# Patient Record
Sex: Male | Born: 1945 | Race: White | Hispanic: No | Marital: Married
Health system: Southern US, Community
[De-identification: ages and names within clinical notes are randomized; demographics above are authoritative.]

---

## 2006-03-25 ENCOUNTER — Inpatient Hospital Stay (HOSPITAL_COMMUNITY): Admission: AD | Admit: 2006-03-25 | Discharge: 2006-04-05 | Payer: Self-pay | Admitting: Pulmonary Disease

## 2006-03-25 ENCOUNTER — Ambulatory Visit: Payer: Self-pay | Admitting: Critical Care Medicine

## 2006-03-27 ENCOUNTER — Ambulatory Visit: Payer: Self-pay | Admitting: Cardiology

## 2006-03-29 ENCOUNTER — Ambulatory Visit: Payer: Self-pay | Admitting: Internal Medicine

## 2006-07-18 ENCOUNTER — Ambulatory Visit: Payer: Self-pay | Admitting: Pulmonary Disease

## 2007-10-16 IMAGING — CR DG CHEST 1V PORT
1 series · 1 of 1 positions shown · non-contrast
Comparison: None.

CLINICAL DATA: Acute renal failure.  Evaluate right IJ line insertion.
 PORTABLE CHEST ? 1 VIEW:

[view not recorded]
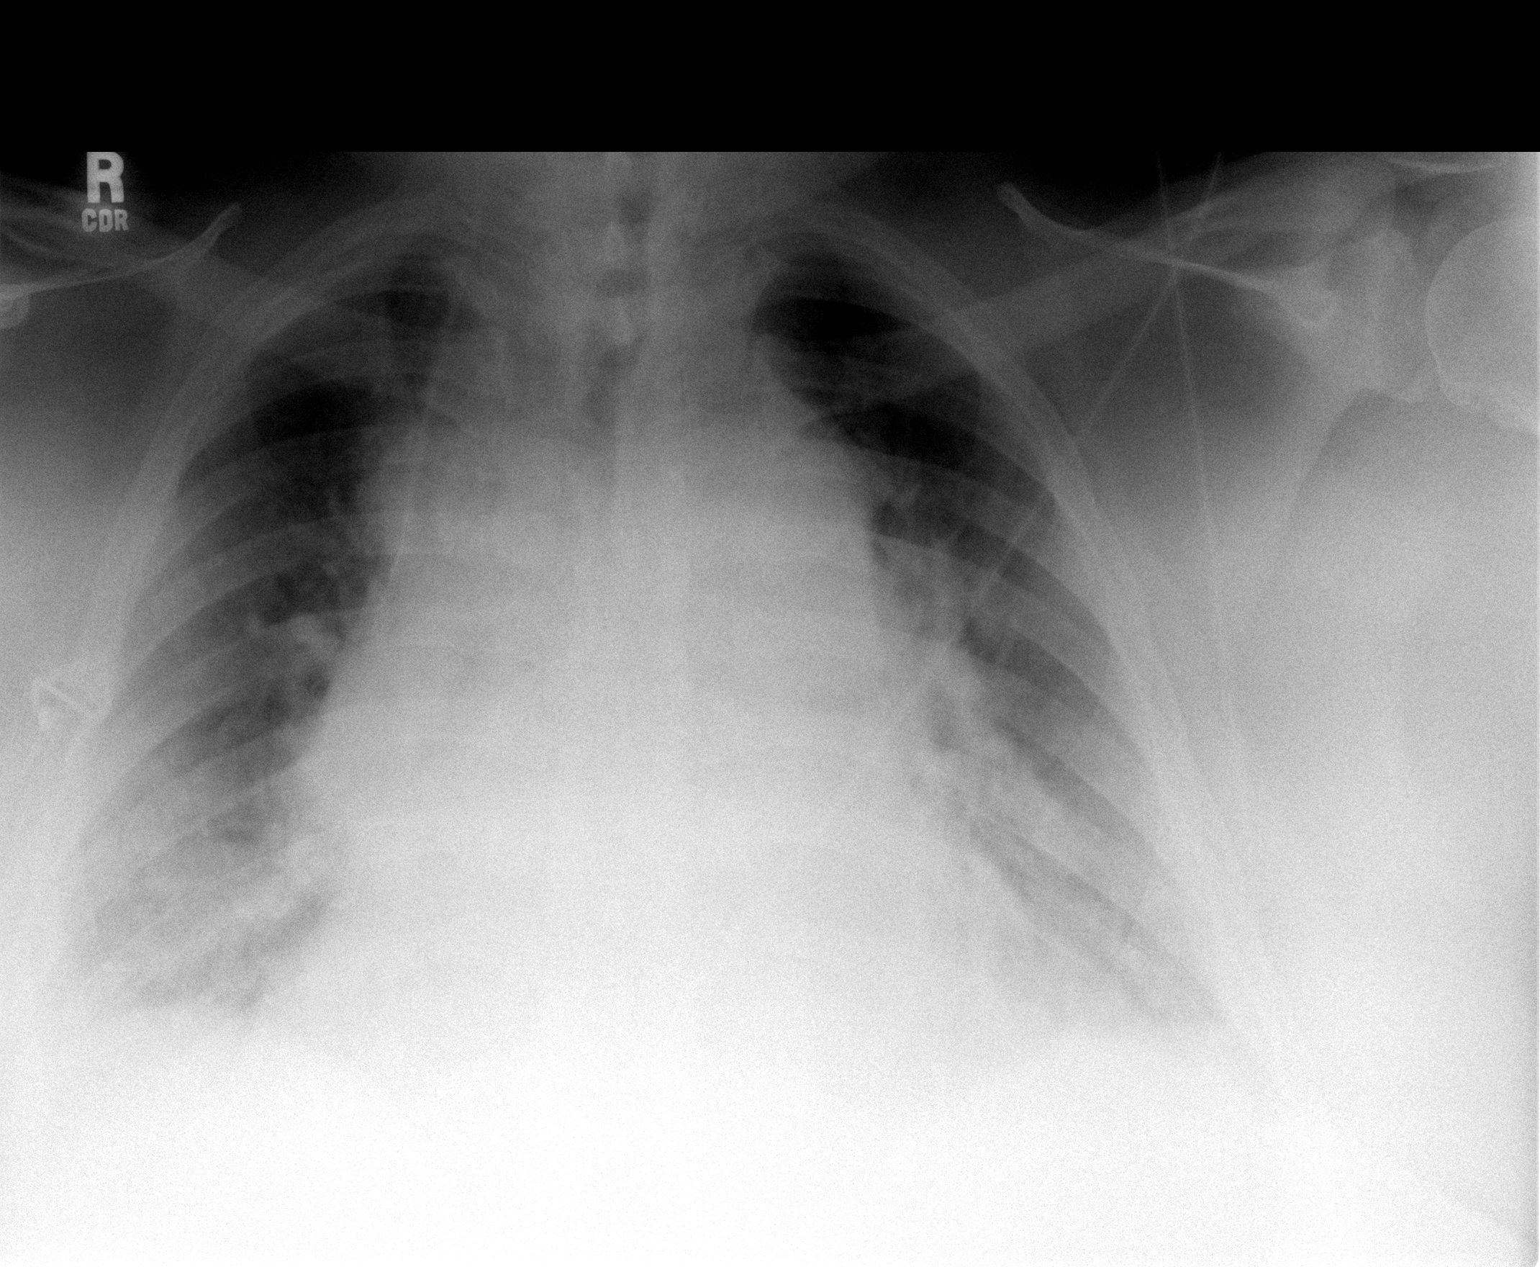

[1 of 1 positions shown; findings below may reference images not displayed]

FINDINGS: Right-sided IJ central venous catheter is noted with tip in the projection of the SVC.  Heart size is enlarged.  There is pulmonary venous congestion.  There is bibasilar atelectasis.
IMPRESSION: 1.  Cardiomegaly and pulmonary venous congestion. 
 2.  Right-sided IJ catheter tip is in the SVC.  No pneumothorax noted.

## 2007-10-17 IMAGING — CR DG CHEST 1V PORT
1 series · 1 of 1 positions shown · non-contrast
Comparison: 03/25/06.

CLINICAL DATA: Acute renal failure.  
 PORTABLE CHEST - 1 VIEW 03/26/06:

[view not recorded]
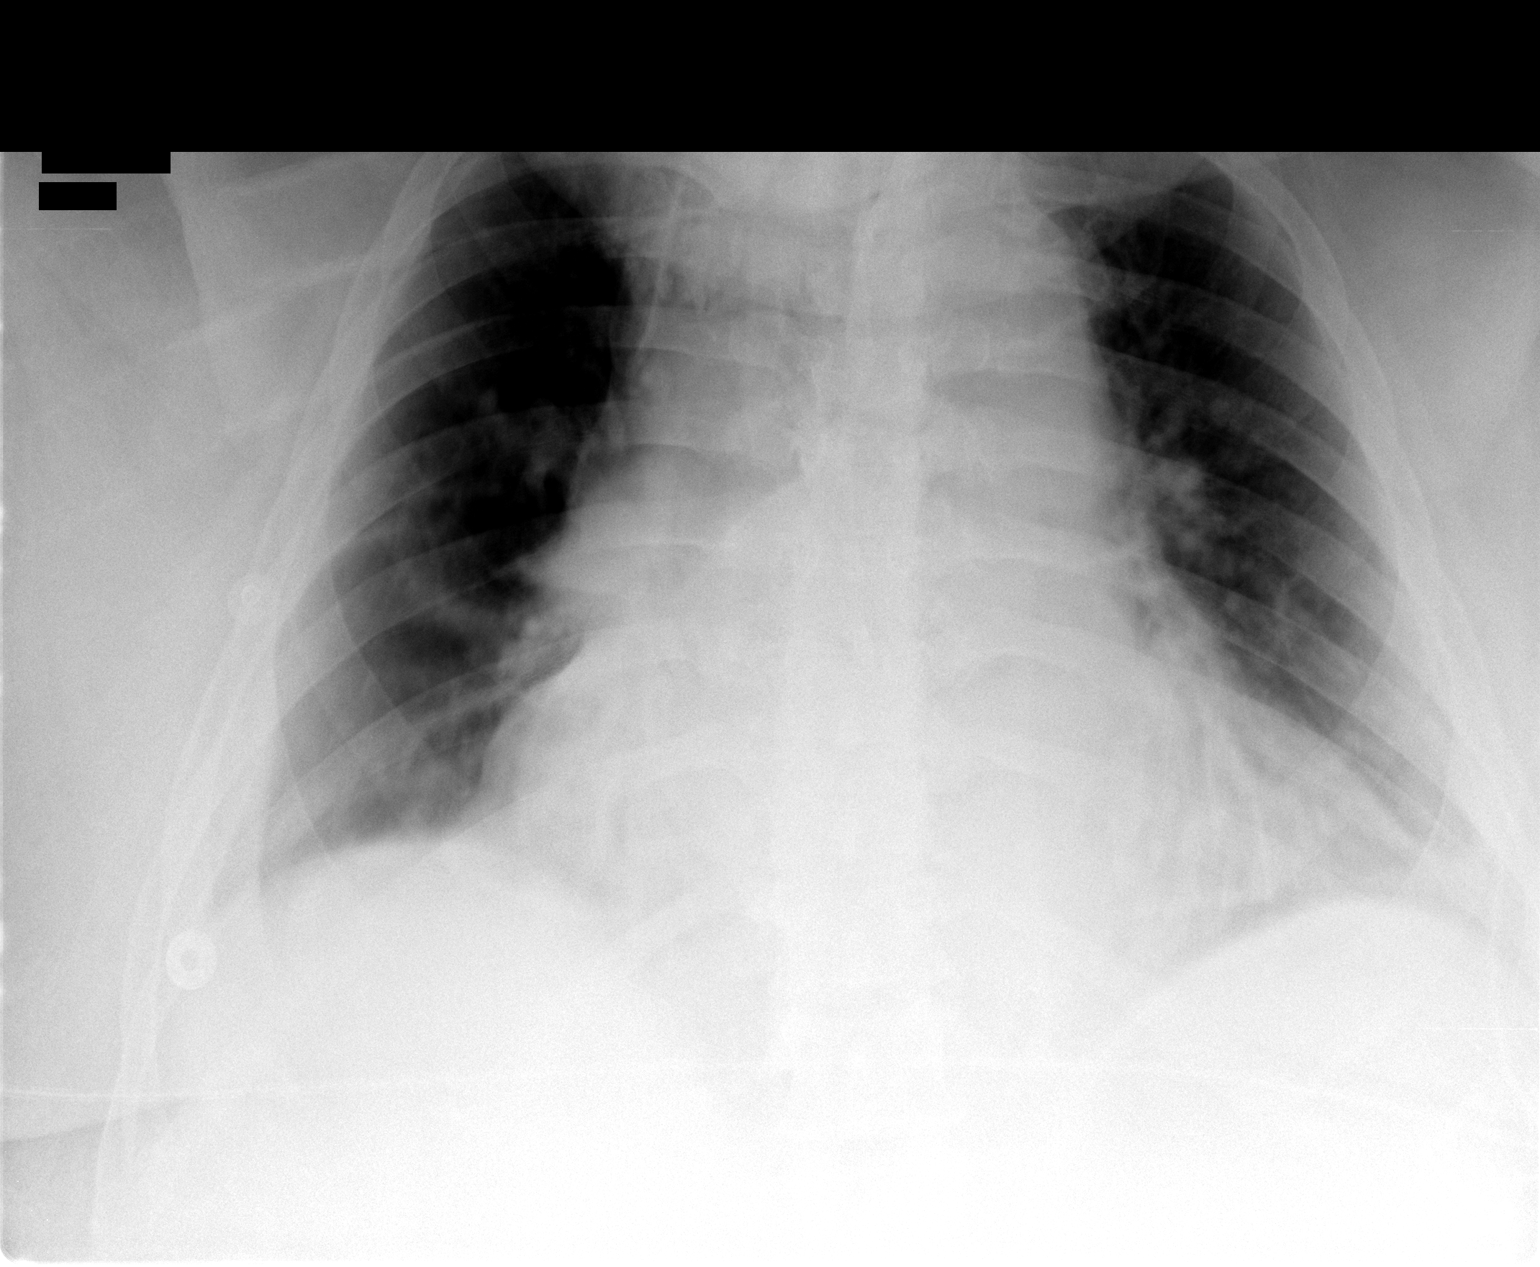

[1 of 1 positions shown; findings below may reference images not displayed]

FINDINGS: Right-sided central line again terminates over the mid superior vena cava.  Exam is mildly motion degraded.  There is mild cardiomegaly.  Prominence of the superior mediastinum is likely technique related.  Decreased interstitial prominence likely relates to decreased pulmonary venous congestion. Improved aeration at the bases consistent with decreased atelectasis.  There may be a small right-sided pleural effusion.  No pneumothorax.
IMPRESSION: 1.  Limited examination due to motion artifact and AP supine technique. 
 2.  Cardiomegaly with resolved pulmonary venous congestion.  
 3.  Decreased bibasilar atelectasis.  
 4.  Prominence of the superior mediastinum is likely technique related but PA and lateral radiographs are recommended when patient is clinically improved.

## 2007-10-18 IMAGING — CR DG CHEST 1V PORT
1 series · 1 of 1 positions shown · non-contrast
Comparison: 03/25/06 AND 03/26/06.

CLINICAL DATA: Acute renal failure.  
 PORTABLE CHEST ? ([DATE] HOURS):

[view not recorded]
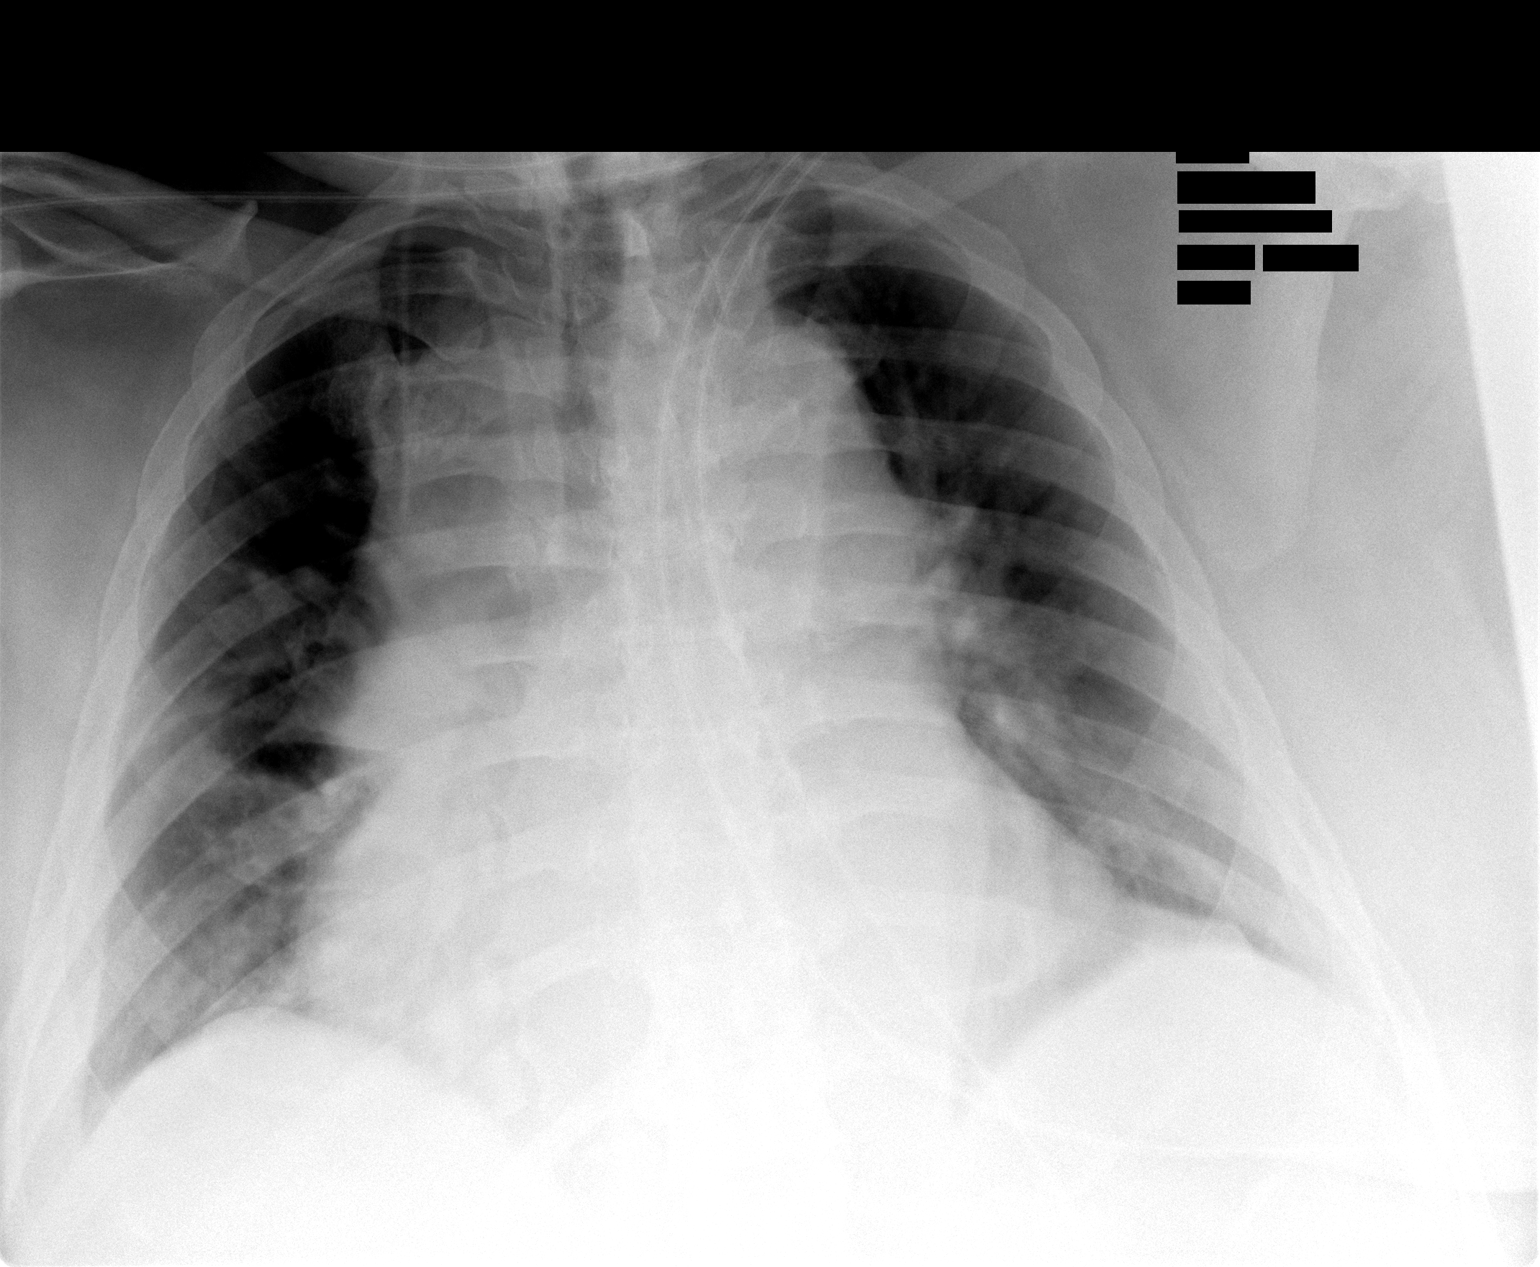

[1 of 1 positions shown; findings below may reference images not displayed]

FINDINGS: There is patient rotation to the right.  Cardiomegaly and superior mediastinal prominence are unchanged.  Bibasilar streaky opacities appear stable and most compatible with atelectasis.  No edema, pleural effusion, or pneumothorax is seen.  Right IJ central venous catheter remains in place.
IMPRESSION: No change in cardiomegaly and bibasilar pulmonary opacities.  Study remains mildly limited by breathing artifact and the patient?s size.

## 2011-10-12 ENCOUNTER — Other Ambulatory Visit: Payer: Self-pay | Admitting: Family Medicine

## 2011-10-12 DIAGNOSIS — I10 Essential (primary) hypertension: Secondary | ICD-10-CM

## 2011-10-14 ENCOUNTER — Other Ambulatory Visit: Payer: Self-pay

## 2012-02-02 DIAGNOSIS — M109 Gout, unspecified: Secondary | ICD-10-CM | POA: Diagnosis not present

## 2012-02-02 DIAGNOSIS — E038 Other specified hypothyroidism: Secondary | ICD-10-CM | POA: Diagnosis not present

## 2012-02-02 DIAGNOSIS — Z79899 Other long term (current) drug therapy: Secondary | ICD-10-CM | POA: Diagnosis not present

## 2012-02-02 DIAGNOSIS — E7800 Pure hypercholesterolemia, unspecified: Secondary | ICD-10-CM | POA: Diagnosis not present

## 2012-02-02 DIAGNOSIS — I10 Essential (primary) hypertension: Secondary | ICD-10-CM | POA: Diagnosis not present

## 2012-02-02 DIAGNOSIS — E119 Type 2 diabetes mellitus without complications: Secondary | ICD-10-CM | POA: Diagnosis not present

## 2012-04-20 DIAGNOSIS — E109 Type 1 diabetes mellitus without complications: Secondary | ICD-10-CM | POA: Diagnosis not present

## 2012-05-31 DIAGNOSIS — Z79899 Other long term (current) drug therapy: Secondary | ICD-10-CM | POA: Diagnosis not present

## 2012-05-31 DIAGNOSIS — E7800 Pure hypercholesterolemia, unspecified: Secondary | ICD-10-CM | POA: Diagnosis not present

## 2012-05-31 DIAGNOSIS — I10 Essential (primary) hypertension: Secondary | ICD-10-CM | POA: Diagnosis not present

## 2012-05-31 DIAGNOSIS — M109 Gout, unspecified: Secondary | ICD-10-CM | POA: Diagnosis not present

## 2012-05-31 DIAGNOSIS — E119 Type 2 diabetes mellitus without complications: Secondary | ICD-10-CM | POA: Diagnosis not present

## 2012-07-06 DIAGNOSIS — R0609 Other forms of dyspnea: Secondary | ICD-10-CM | POA: Diagnosis not present

## 2012-07-06 DIAGNOSIS — R0989 Other specified symptoms and signs involving the circulatory and respiratory systems: Secondary | ICD-10-CM | POA: Diagnosis not present

## 2012-07-06 DIAGNOSIS — I10 Essential (primary) hypertension: Secondary | ICD-10-CM | POA: Diagnosis not present

## 2012-08-31 DIAGNOSIS — E7800 Pure hypercholesterolemia, unspecified: Secondary | ICD-10-CM | POA: Diagnosis not present

## 2012-08-31 DIAGNOSIS — Z23 Encounter for immunization: Secondary | ICD-10-CM | POA: Diagnosis not present

## 2012-08-31 DIAGNOSIS — I10 Essential (primary) hypertension: Secondary | ICD-10-CM | POA: Diagnosis not present

## 2012-08-31 DIAGNOSIS — M109 Gout, unspecified: Secondary | ICD-10-CM | POA: Diagnosis not present

## 2012-08-31 DIAGNOSIS — E119 Type 2 diabetes mellitus without complications: Secondary | ICD-10-CM | POA: Diagnosis not present

## 2012-11-30 DIAGNOSIS — I10 Essential (primary) hypertension: Secondary | ICD-10-CM | POA: Diagnosis not present

## 2012-11-30 DIAGNOSIS — E038 Other specified hypothyroidism: Secondary | ICD-10-CM | POA: Diagnosis not present

## 2012-11-30 DIAGNOSIS — Z0000 Encounter for general adult medical examination without abnormal findings: Secondary | ICD-10-CM | POA: Diagnosis not present

## 2012-11-30 DIAGNOSIS — R195 Other fecal abnormalities: Secondary | ICD-10-CM | POA: Diagnosis not present

## 2012-11-30 DIAGNOSIS — M109 Gout, unspecified: Secondary | ICD-10-CM | POA: Diagnosis not present

## 2012-11-30 DIAGNOSIS — I120 Hypertensive chronic kidney disease with stage 5 chronic kidney disease or end stage renal disease: Secondary | ICD-10-CM | POA: Diagnosis not present

## 2012-11-30 DIAGNOSIS — E119 Type 2 diabetes mellitus without complications: Secondary | ICD-10-CM | POA: Diagnosis not present

## 2012-11-30 DIAGNOSIS — E7800 Pure hypercholesterolemia, unspecified: Secondary | ICD-10-CM | POA: Diagnosis not present

## 2012-11-30 DIAGNOSIS — N400 Enlarged prostate without lower urinary tract symptoms: Secondary | ICD-10-CM | POA: Diagnosis not present

## 2013-02-28 DIAGNOSIS — I129 Hypertensive chronic kidney disease with stage 1 through stage 4 chronic kidney disease, or unspecified chronic kidney disease: Secondary | ICD-10-CM | POA: Diagnosis not present

## 2013-02-28 DIAGNOSIS — M109 Gout, unspecified: Secondary | ICD-10-CM | POA: Diagnosis not present

## 2013-02-28 DIAGNOSIS — E7800 Pure hypercholesterolemia, unspecified: Secondary | ICD-10-CM | POA: Diagnosis not present

## 2013-02-28 DIAGNOSIS — E1129 Type 2 diabetes mellitus with other diabetic kidney complication: Secondary | ICD-10-CM | POA: Diagnosis not present

## 2013-02-28 DIAGNOSIS — I10 Essential (primary) hypertension: Secondary | ICD-10-CM | POA: Diagnosis not present

## 2013-04-17 DIAGNOSIS — E109 Type 1 diabetes mellitus without complications: Secondary | ICD-10-CM | POA: Diagnosis not present

## 2013-06-13 DIAGNOSIS — IMO0002 Reserved for concepts with insufficient information to code with codable children: Secondary | ICD-10-CM | POA: Diagnosis not present

## 2013-06-13 DIAGNOSIS — E038 Other specified hypothyroidism: Secondary | ICD-10-CM | POA: Diagnosis not present

## 2013-06-13 DIAGNOSIS — I10 Essential (primary) hypertension: Secondary | ICD-10-CM | POA: Diagnosis not present

## 2013-06-13 DIAGNOSIS — E669 Obesity, unspecified: Secondary | ICD-10-CM | POA: Diagnosis not present

## 2013-06-13 DIAGNOSIS — S91309A Unspecified open wound, unspecified foot, initial encounter: Secondary | ICD-10-CM | POA: Diagnosis not present

## 2013-06-13 DIAGNOSIS — E7800 Pure hypercholesterolemia, unspecified: Secondary | ICD-10-CM | POA: Diagnosis not present

## 2013-06-13 DIAGNOSIS — E1129 Type 2 diabetes mellitus with other diabetic kidney complication: Secondary | ICD-10-CM | POA: Diagnosis not present

## 2013-07-24 DIAGNOSIS — E119 Type 2 diabetes mellitus without complications: Secondary | ICD-10-CM | POA: Diagnosis not present

## 2013-07-24 DIAGNOSIS — I129 Hypertensive chronic kidney disease with stage 1 through stage 4 chronic kidney disease, or unspecified chronic kidney disease: Secondary | ICD-10-CM | POA: Diagnosis not present

## 2013-07-24 DIAGNOSIS — Z8349 Family history of other endocrine, nutritional and metabolic diseases: Secondary | ICD-10-CM | POA: Diagnosis not present

## 2013-07-24 DIAGNOSIS — N184 Chronic kidney disease, stage 4 (severe): Secondary | ICD-10-CM | POA: Diagnosis not present

## 2013-07-24 DIAGNOSIS — E1101 Type 2 diabetes mellitus with hyperosmolarity with coma: Secondary | ICD-10-CM | POA: Diagnosis not present

## 2013-08-16 DIAGNOSIS — N184 Chronic kidney disease, stage 4 (severe): Secondary | ICD-10-CM | POA: Diagnosis not present

## 2013-08-16 DIAGNOSIS — E1129 Type 2 diabetes mellitus with other diabetic kidney complication: Secondary | ICD-10-CM | POA: Diagnosis not present

## 2013-08-16 DIAGNOSIS — I10 Essential (primary) hypertension: Secondary | ICD-10-CM | POA: Diagnosis not present

## 2013-08-16 DIAGNOSIS — I129 Hypertensive chronic kidney disease with stage 1 through stage 4 chronic kidney disease, or unspecified chronic kidney disease: Secondary | ICD-10-CM | POA: Diagnosis not present

## 2013-08-16 DIAGNOSIS — E7800 Pure hypercholesterolemia, unspecified: Secondary | ICD-10-CM | POA: Diagnosis not present

## 2013-08-16 DIAGNOSIS — E038 Other specified hypothyroidism: Secondary | ICD-10-CM | POA: Diagnosis not present

## 2013-08-16 DIAGNOSIS — Z23 Encounter for immunization: Secondary | ICD-10-CM | POA: Diagnosis not present

## 2013-08-30 DIAGNOSIS — I129 Hypertensive chronic kidney disease with stage 1 through stage 4 chronic kidney disease, or unspecified chronic kidney disease: Secondary | ICD-10-CM | POA: Diagnosis not present

## 2013-08-30 DIAGNOSIS — N184 Chronic kidney disease, stage 4 (severe): Secondary | ICD-10-CM | POA: Diagnosis not present

## 2013-08-30 DIAGNOSIS — N189 Chronic kidney disease, unspecified: Secondary | ICD-10-CM | POA: Diagnosis not present

## 2013-08-30 DIAGNOSIS — E119 Type 2 diabetes mellitus without complications: Secondary | ICD-10-CM | POA: Diagnosis not present

## 2013-10-17 DIAGNOSIS — E1129 Type 2 diabetes mellitus with other diabetic kidney complication: Secondary | ICD-10-CM | POA: Diagnosis not present

## 2013-10-17 DIAGNOSIS — E7800 Pure hypercholesterolemia, unspecified: Secondary | ICD-10-CM | POA: Diagnosis not present

## 2013-10-17 DIAGNOSIS — I129 Hypertensive chronic kidney disease with stage 1 through stage 4 chronic kidney disease, or unspecified chronic kidney disease: Secondary | ICD-10-CM | POA: Diagnosis not present

## 2013-10-17 DIAGNOSIS — M109 Gout, unspecified: Secondary | ICD-10-CM | POA: Diagnosis not present

## 2013-10-17 DIAGNOSIS — I10 Essential (primary) hypertension: Secondary | ICD-10-CM | POA: Diagnosis not present

## 2013-10-17 DIAGNOSIS — Z79899 Other long term (current) drug therapy: Secondary | ICD-10-CM | POA: Diagnosis not present

## 2013-11-29 DIAGNOSIS — N184 Chronic kidney disease, stage 4 (severe): Secondary | ICD-10-CM | POA: Diagnosis not present

## 2013-11-29 DIAGNOSIS — I10 Essential (primary) hypertension: Secondary | ICD-10-CM | POA: Diagnosis not present

## 2013-11-29 DIAGNOSIS — E559 Vitamin D deficiency, unspecified: Secondary | ICD-10-CM | POA: Diagnosis not present

## 2014-01-10 DIAGNOSIS — I10 Essential (primary) hypertension: Secondary | ICD-10-CM | POA: Diagnosis not present

## 2014-01-10 DIAGNOSIS — E1129 Type 2 diabetes mellitus with other diabetic kidney complication: Secondary | ICD-10-CM | POA: Diagnosis not present

## 2014-01-10 DIAGNOSIS — N184 Chronic kidney disease, stage 4 (severe): Secondary | ICD-10-CM | POA: Diagnosis not present

## 2014-01-10 DIAGNOSIS — I129 Hypertensive chronic kidney disease with stage 1 through stage 4 chronic kidney disease, or unspecified chronic kidney disease: Secondary | ICD-10-CM | POA: Diagnosis not present

## 2014-01-10 DIAGNOSIS — E7800 Pure hypercholesterolemia, unspecified: Secondary | ICD-10-CM | POA: Diagnosis not present

## 2014-01-15 DIAGNOSIS — N184 Chronic kidney disease, stage 4 (severe): Secondary | ICD-10-CM | POA: Diagnosis not present

## 2014-02-26 DIAGNOSIS — E119 Type 2 diabetes mellitus without complications: Secondary | ICD-10-CM | POA: Diagnosis not present

## 2014-02-26 DIAGNOSIS — N184 Chronic kidney disease, stage 4 (severe): Secondary | ICD-10-CM | POA: Diagnosis not present

## 2014-02-26 DIAGNOSIS — I10 Essential (primary) hypertension: Secondary | ICD-10-CM | POA: Diagnosis not present

## 2014-04-05 DIAGNOSIS — E119 Type 2 diabetes mellitus without complications: Secondary | ICD-10-CM | POA: Diagnosis not present

## 2014-04-05 DIAGNOSIS — Z961 Presence of intraocular lens: Secondary | ICD-10-CM | POA: Diagnosis not present

## 2014-04-05 DIAGNOSIS — H04129 Dry eye syndrome of unspecified lacrimal gland: Secondary | ICD-10-CM | POA: Diagnosis not present

## 2014-04-15 DIAGNOSIS — Z79899 Other long term (current) drug therapy: Secondary | ICD-10-CM | POA: Diagnosis not present

## 2014-04-15 DIAGNOSIS — E7800 Pure hypercholesterolemia, unspecified: Secondary | ICD-10-CM | POA: Diagnosis not present

## 2014-04-15 DIAGNOSIS — E1129 Type 2 diabetes mellitus with other diabetic kidney complication: Secondary | ICD-10-CM | POA: Diagnosis not present

## 2014-04-15 DIAGNOSIS — I10 Essential (primary) hypertension: Secondary | ICD-10-CM | POA: Diagnosis not present

## 2014-04-15 DIAGNOSIS — I129 Hypertensive chronic kidney disease with stage 1 through stage 4 chronic kidney disease, or unspecified chronic kidney disease: Secondary | ICD-10-CM | POA: Diagnosis not present

## 2014-05-08 DIAGNOSIS — I10 Essential (primary) hypertension: Secondary | ICD-10-CM | POA: Diagnosis not present

## 2014-05-08 DIAGNOSIS — N184 Chronic kidney disease, stage 4 (severe): Secondary | ICD-10-CM | POA: Diagnosis not present

## 2014-05-08 DIAGNOSIS — E119 Type 2 diabetes mellitus without complications: Secondary | ICD-10-CM | POA: Diagnosis not present

## 2014-07-17 DIAGNOSIS — E1129 Type 2 diabetes mellitus with other diabetic kidney complication: Secondary | ICD-10-CM | POA: Diagnosis not present

## 2014-07-17 DIAGNOSIS — I10 Essential (primary) hypertension: Secondary | ICD-10-CM | POA: Diagnosis not present

## 2014-07-17 DIAGNOSIS — E7800 Pure hypercholesterolemia, unspecified: Secondary | ICD-10-CM | POA: Diagnosis not present

## 2014-07-17 DIAGNOSIS — R0902 Hypoxemia: Secondary | ICD-10-CM | POA: Diagnosis not present

## 2014-07-17 DIAGNOSIS — N184 Chronic kidney disease, stage 4 (severe): Secondary | ICD-10-CM | POA: Diagnosis not present

## 2014-07-17 DIAGNOSIS — I129 Hypertensive chronic kidney disease with stage 1 through stage 4 chronic kidney disease, or unspecified chronic kidney disease: Secondary | ICD-10-CM | POA: Diagnosis not present

## 2014-07-24 DIAGNOSIS — R0902 Hypoxemia: Secondary | ICD-10-CM | POA: Diagnosis not present

## 2014-09-11 DIAGNOSIS — E119 Type 2 diabetes mellitus without complications: Secondary | ICD-10-CM | POA: Diagnosis not present

## 2014-09-11 DIAGNOSIS — N184 Chronic kidney disease, stage 4 (severe): Secondary | ICD-10-CM | POA: Diagnosis not present

## 2014-09-11 DIAGNOSIS — I10 Essential (primary) hypertension: Secondary | ICD-10-CM | POA: Diagnosis not present

## 2014-10-16 DIAGNOSIS — G4733 Obstructive sleep apnea (adult) (pediatric): Secondary | ICD-10-CM | POA: Diagnosis not present

## 2014-10-16 DIAGNOSIS — I10 Essential (primary) hypertension: Secondary | ICD-10-CM | POA: Diagnosis not present

## 2014-10-16 DIAGNOSIS — E1129 Type 2 diabetes mellitus with other diabetic kidney complication: Secondary | ICD-10-CM | POA: Diagnosis not present

## 2014-10-16 DIAGNOSIS — N184 Chronic kidney disease, stage 4 (severe): Secondary | ICD-10-CM | POA: Diagnosis not present

## 2014-10-16 DIAGNOSIS — I129 Hypertensive chronic kidney disease with stage 1 through stage 4 chronic kidney disease, or unspecified chronic kidney disease: Secondary | ICD-10-CM | POA: Diagnosis not present

## 2014-10-16 DIAGNOSIS — E780 Pure hypercholesterolemia: Secondary | ICD-10-CM | POA: Diagnosis not present

## 2014-11-04 DIAGNOSIS — N184 Chronic kidney disease, stage 4 (severe): Secondary | ICD-10-CM | POA: Diagnosis not present

## 2014-11-04 DIAGNOSIS — R05 Cough: Secondary | ICD-10-CM | POA: Diagnosis not present

## 2014-11-28 DIAGNOSIS — E780 Pure hypercholesterolemia: Secondary | ICD-10-CM | POA: Diagnosis not present

## 2014-11-28 DIAGNOSIS — Z23 Encounter for immunization: Secondary | ICD-10-CM | POA: Diagnosis not present

## 2014-11-28 DIAGNOSIS — E1129 Type 2 diabetes mellitus with other diabetic kidney complication: Secondary | ICD-10-CM | POA: Diagnosis not present

## 2014-11-28 DIAGNOSIS — I10 Essential (primary) hypertension: Secondary | ICD-10-CM | POA: Diagnosis not present

## 2015-01-16 DIAGNOSIS — E1129 Type 2 diabetes mellitus with other diabetic kidney complication: Secondary | ICD-10-CM | POA: Diagnosis not present

## 2015-01-16 DIAGNOSIS — E780 Pure hypercholesterolemia: Secondary | ICD-10-CM | POA: Diagnosis not present

## 2015-01-16 DIAGNOSIS — M1000 Idiopathic gout, unspecified site: Secondary | ICD-10-CM | POA: Diagnosis not present

## 2015-01-16 DIAGNOSIS — I129 Hypertensive chronic kidney disease with stage 1 through stage 4 chronic kidney disease, or unspecified chronic kidney disease: Secondary | ICD-10-CM | POA: Diagnosis not present

## 2015-01-16 DIAGNOSIS — M795 Residual foreign body in soft tissue: Secondary | ICD-10-CM | POA: Diagnosis not present

## 2015-01-16 DIAGNOSIS — I10 Essential (primary) hypertension: Secondary | ICD-10-CM | POA: Diagnosis not present

## 2015-01-16 DIAGNOSIS — N184 Chronic kidney disease, stage 4 (severe): Secondary | ICD-10-CM | POA: Diagnosis not present

## 2015-02-26 DIAGNOSIS — E559 Vitamin D deficiency, unspecified: Secondary | ICD-10-CM | POA: Diagnosis not present

## 2015-02-26 DIAGNOSIS — N184 Chronic kidney disease, stage 4 (severe): Secondary | ICD-10-CM | POA: Diagnosis not present

## 2015-02-26 DIAGNOSIS — Z0000 Encounter for general adult medical examination without abnormal findings: Secondary | ICD-10-CM | POA: Diagnosis not present

## 2015-02-26 DIAGNOSIS — Z23 Encounter for immunization: Secondary | ICD-10-CM | POA: Diagnosis not present

## 2015-02-26 DIAGNOSIS — G4733 Obstructive sleep apnea (adult) (pediatric): Secondary | ICD-10-CM | POA: Diagnosis not present

## 2015-02-26 DIAGNOSIS — E780 Pure hypercholesterolemia: Secondary | ICD-10-CM | POA: Diagnosis not present

## 2015-02-26 DIAGNOSIS — I129 Hypertensive chronic kidney disease with stage 1 through stage 4 chronic kidney disease, or unspecified chronic kidney disease: Secondary | ICD-10-CM | POA: Diagnosis not present

## 2015-02-26 DIAGNOSIS — E1129 Type 2 diabetes mellitus with other diabetic kidney complication: Secondary | ICD-10-CM | POA: Diagnosis not present

## 2015-02-26 DIAGNOSIS — I10 Essential (primary) hypertension: Secondary | ICD-10-CM | POA: Diagnosis not present

## 2015-02-26 DIAGNOSIS — F329 Major depressive disorder, single episode, unspecified: Secondary | ICD-10-CM | POA: Diagnosis not present

## 2015-02-26 DIAGNOSIS — M1000 Idiopathic gout, unspecified site: Secondary | ICD-10-CM | POA: Diagnosis not present

## 2015-11-05 DIAGNOSIS — Z23 Encounter for immunization: Secondary | ICD-10-CM | POA: Diagnosis not present

## 2015-11-12 DIAGNOSIS — I10 Essential (primary) hypertension: Secondary | ICD-10-CM | POA: Diagnosis not present

## 2015-11-12 DIAGNOSIS — M1000 Idiopathic gout, unspecified site: Secondary | ICD-10-CM | POA: Diagnosis not present

## 2015-11-12 DIAGNOSIS — E7800 Pure hypercholesterolemia, unspecified: Secondary | ICD-10-CM | POA: Diagnosis not present

## 2015-11-12 DIAGNOSIS — Z79899 Other long term (current) drug therapy: Secondary | ICD-10-CM | POA: Diagnosis not present

## 2015-11-12 DIAGNOSIS — Z23 Encounter for immunization: Secondary | ICD-10-CM | POA: Diagnosis not present

## 2015-11-12 DIAGNOSIS — E1129 Type 2 diabetes mellitus with other diabetic kidney complication: Secondary | ICD-10-CM | POA: Diagnosis not present

## 2015-11-12 DIAGNOSIS — I129 Hypertensive chronic kidney disease with stage 1 through stage 4 chronic kidney disease, or unspecified chronic kidney disease: Secondary | ICD-10-CM | POA: Diagnosis not present

## 2015-11-14 DIAGNOSIS — E7800 Pure hypercholesterolemia, unspecified: Secondary | ICD-10-CM | POA: Diagnosis not present

## 2015-11-14 DIAGNOSIS — I129 Hypertensive chronic kidney disease with stage 1 through stage 4 chronic kidney disease, or unspecified chronic kidney disease: Secondary | ICD-10-CM | POA: Diagnosis not present

## 2015-11-14 DIAGNOSIS — E1129 Type 2 diabetes mellitus with other diabetic kidney complication: Secondary | ICD-10-CM | POA: Diagnosis not present

## 2015-11-14 DIAGNOSIS — Z79899 Other long term (current) drug therapy: Secondary | ICD-10-CM | POA: Diagnosis not present

## 2015-11-14 DIAGNOSIS — M1000 Idiopathic gout, unspecified site: Secondary | ICD-10-CM | POA: Diagnosis not present

## 2015-11-14 DIAGNOSIS — I10 Essential (primary) hypertension: Secondary | ICD-10-CM | POA: Diagnosis not present

## 2015-12-01 DIAGNOSIS — N184 Chronic kidney disease, stage 4 (severe): Secondary | ICD-10-CM | POA: Diagnosis not present

## 2015-12-01 DIAGNOSIS — I10 Essential (primary) hypertension: Secondary | ICD-10-CM | POA: Diagnosis not present

## 2016-02-11 DIAGNOSIS — Z79899 Other long term (current) drug therapy: Secondary | ICD-10-CM | POA: Diagnosis not present

## 2016-02-11 DIAGNOSIS — E7800 Pure hypercholesterolemia, unspecified: Secondary | ICD-10-CM | POA: Diagnosis not present

## 2016-02-11 DIAGNOSIS — M1000 Idiopathic gout, unspecified site: Secondary | ICD-10-CM | POA: Diagnosis not present

## 2016-02-11 DIAGNOSIS — E1129 Type 2 diabetes mellitus with other diabetic kidney complication: Secondary | ICD-10-CM | POA: Diagnosis not present

## 2016-02-12 DIAGNOSIS — M1000 Idiopathic gout, unspecified site: Secondary | ICD-10-CM | POA: Diagnosis not present

## 2016-02-12 DIAGNOSIS — E7800 Pure hypercholesterolemia, unspecified: Secondary | ICD-10-CM | POA: Diagnosis not present

## 2016-02-12 DIAGNOSIS — E1129 Type 2 diabetes mellitus with other diabetic kidney complication: Secondary | ICD-10-CM | POA: Diagnosis not present

## 2016-02-12 DIAGNOSIS — I10 Essential (primary) hypertension: Secondary | ICD-10-CM | POA: Diagnosis not present

## 2016-02-12 DIAGNOSIS — I129 Hypertensive chronic kidney disease with stage 1 through stage 4 chronic kidney disease, or unspecified chronic kidney disease: Secondary | ICD-10-CM | POA: Diagnosis not present

## 2016-02-26 DIAGNOSIS — E119 Type 2 diabetes mellitus without complications: Secondary | ICD-10-CM | POA: Diagnosis not present

## 2016-02-26 DIAGNOSIS — N184 Chronic kidney disease, stage 4 (severe): Secondary | ICD-10-CM | POA: Diagnosis not present

## 2016-02-26 DIAGNOSIS — I10 Essential (primary) hypertension: Secondary | ICD-10-CM | POA: Diagnosis not present

## 2016-03-18 DIAGNOSIS — E119 Type 2 diabetes mellitus without complications: Secondary | ICD-10-CM | POA: Diagnosis not present

## 2016-04-15 ENCOUNTER — Encounter: Payer: Self-pay | Admitting: Sports Medicine

## 2016-04-15 ENCOUNTER — Ambulatory Visit: Payer: PPO

## 2016-04-15 ENCOUNTER — Ambulatory Visit (INDEPENDENT_AMBULATORY_CARE_PROVIDER_SITE_OTHER): Payer: PPO | Admitting: Sports Medicine

## 2016-04-15 DIAGNOSIS — M25571 Pain in right ankle and joints of right foot: Secondary | ICD-10-CM

## 2016-04-15 DIAGNOSIS — M7671 Peroneal tendinitis, right leg: Secondary | ICD-10-CM

## 2016-04-15 DIAGNOSIS — M79671 Pain in right foot: Secondary | ICD-10-CM | POA: Diagnosis not present

## 2016-04-15 DIAGNOSIS — E1142 Type 2 diabetes mellitus with diabetic polyneuropathy: Secondary | ICD-10-CM | POA: Diagnosis not present

## 2016-04-15 DIAGNOSIS — E1151 Type 2 diabetes mellitus with diabetic peripheral angiopathy without gangrene: Secondary | ICD-10-CM

## 2016-04-15 DIAGNOSIS — L853 Xerosis cutis: Secondary | ICD-10-CM | POA: Diagnosis not present

## 2016-04-15 DIAGNOSIS — G5751 Tarsal tunnel syndrome, right lower limb: Secondary | ICD-10-CM | POA: Diagnosis not present

## 2016-04-15 MED ORDER — TRIAMCINOLONE ACETONIDE 10 MG/ML IJ SUSP: 10.0000 mg | Freq: Once | INTRAMUSCULAR | Status: AC

## 2016-04-15 NOTE — Progress Notes (Signed)
Patient ID: Jason Lowe, male   DOB: 1946-03-28, 70 y.o.   MRN: [id number] Subjective: Jason Lowe is a 70 y.o. diabetic male patient who presents to office for evaluation of right foot and ankle pain. Patient complains of progressive pain especially over the last 5-6 weeks at the right foot and ankle. Reports no pain at rest. Reports pain with first few steps and with long period of standing and walking. States that generally starts as pain over the top of the foot that radiates to the bottom and goes to the inside of the ankle and then outside of the ankle states that his been pretty persistent over the last 5 weeks hurting every day. Some days not so much bothersome as other days. Patient is also assisted by wife states that her husband wear shoes without socks. He suffers with dry skin that is concerning and also has not upgraded for change tissues and about 11 years. Patient reports that in the house. He wears flip-flops and that this does not add any extra comfort or support to his feet.. Patient denies any other pedal complaints. Denies injury/trip/fall/sprain/any causative factors, however, reports that sometimes when he is sitting at the computer He will rest 1 foot on top of the other and is wondering if this is contributing to his pain and symptoms. Admits to baseline numbness, tingling and burning, Unchanged from prior, and swelling in both feet and legs, Unchanged from prior.  Blood sugar this morning 130 mg/dl  There are no active problems to display for this patient.   No current outpatient prescriptions on file prior to visit.   No current facility-administered medications on file prior to visit.    No Known Allergies  Objective:  General: Alert and oriented x3 in no acute distress; obese stature  Dermatology: Skin is diffusely dry bilateral.  No open lesions bilateral lower extremities, no webspace macerations, no ecchymosis bilateral, all nails x 10 are short, thickened,  discolored, but well manicured.  Vascular: Dorsalis Pedis and Posterior Tibial pedal pulses palpable, faint secondary to 1+ pitting edema, Capillary Fill Time <5 seconds,(-) pedal hair growth bilateral, varicosities bilateral with generalized hyperpigmentation suggestive of PVD, Temperature gradient within normal limits.  Neurology: Johney Maine sensation intact via light touch bilateral, Protective sensation diminished with Thornell Mule Monofilament to all pedal sites, vibratory diminished bilateral, No babinski sign present bilateral. (- )Tinels sign bilateral.   Musculoskeletal: Mild tenderness with palpation at sinus tarsi, right ankle and peroneal tendon course, Right foot and ankle,No pain with calf compression bilateral. There is decreased ankle rom with knee extending  vs flexed resembling gastroc equnius bilateral, Subtalar, and midtarsal joint range of motion is limited, right greater than left, there is no 1st ray hypermobility noted bilateral, decreased 1st MPJ rom Right>Left with functional limitus noted on weightbearing exam. Asymptomatic hammertoes. Cavovarus foot type bilateral. Strength within normal limits in all groups bilateral.   Xrays  Right Foot   Impression: Normal osseous mineralization is diffuse arthritis noted with significant posterior and inferior heel spur, hammertoe deformity, no fracture, soft tissue margins within normal limits, mild edema versus large body habitus. No foreign body.  Assessment and Plan: Problem List Items Addressed This Visit    None    Visit Diagnoses    Right foot pain    -  Primary    Relevant Orders    DG Foot 2 Views Right    Sinus tarsi syndrome of right ankle        Relevant Medications  triamcinolone acetonide (KENALOG) 10 MG/ML injection 10 mg    Peroneal tendonitis of right lower extremity        Diabetic peripheral vascular disorder (HCC)        Venous hyperpigmentation and lower extremity swelling    Relevant Medications     carvedilol (COREG) 25 MG tablet    atorvastatin (LIPITOR) 40 MG tablet    furosemide (LASIX) 40 MG tablet    Choline Fenofibrate (TRILIPIX) 135 MG capsule    minoxidil (LONITEN) 10 MG tablet    spironolactone (ALDACTONE) 50 MG tablet    insulin detemir (LEVEMIR) 100 UNIT/ML injection    Diabetic polyneuropathy associated with type 2 diabetes mellitus (HCC)        Relevant Medications    zolpidem (AMBIEN) 10 MG tablet    atorvastatin (LIPITOR) 40 MG tablet    FLUoxetine (PROZAC) 40 MG capsule    insulin detemir (LEVEMIR) 100 UNIT/ML injection    Dry skin           -Complete examination performed -Xrays reviewed -Discussed treatement options for likely sinus tarsi syndrome with compensation peroneal tendinitis with arthritis complicated by diabetes with neuropathy and peripheral vascular disease/venous stasis -After oral consent and aseptic prep, injected a mixture containing 1 ml of 2%  plain lidocaine, 1 ml 0.5% plain marcaine, 0.5 ml of kenalog 10 and 0.5 ml of dexamethasone phosphate into right ankle at sinus tarsi without complication. Post-injection care discussed with patient.  -Advised patient at end of day to ice. May soak with Epsom salt but advised patient to follow with icing after soaking to help with swelling and symptoms -Encouraged lower limb elevation to assist with edema control -Recommend patient to get new pair of SAS shoes -Advised patient to refrain from wearing nonsupportive shoes flip-flops and shoes without cushion or socks -Recommend range of motion exercises and getting of day even for getting out of bed as instructed -Recommend daily skin emollients for dry skin -Patient to return to office in 3 weeks or sooner if condition worsens. May consider anti-inflammatory medicine at next visit if symptoms still persist.   Landis Martins, DPM

## 2016-05-05 ENCOUNTER — Ambulatory Visit: Payer: PPO | Admitting: Sports Medicine

## 2016-05-06 ENCOUNTER — Ambulatory Visit: Payer: PPO | Admitting: Sports Medicine

## 2016-05-20 DIAGNOSIS — I10 Essential (primary) hypertension: Secondary | ICD-10-CM | POA: Diagnosis not present

## 2016-05-20 DIAGNOSIS — E7800 Pure hypercholesterolemia, unspecified: Secondary | ICD-10-CM | POA: Diagnosis not present

## 2016-05-20 DIAGNOSIS — R946 Abnormal results of thyroid function studies: Secondary | ICD-10-CM | POA: Diagnosis not present

## 2016-05-20 DIAGNOSIS — E1129 Type 2 diabetes mellitus with other diabetic kidney complication: Secondary | ICD-10-CM | POA: Diagnosis not present

## 2016-05-20 DIAGNOSIS — I129 Hypertensive chronic kidney disease with stage 1 through stage 4 chronic kidney disease, or unspecified chronic kidney disease: Secondary | ICD-10-CM | POA: Diagnosis not present

## 2016-09-02 DIAGNOSIS — E559 Vitamin D deficiency, unspecified: Secondary | ICD-10-CM | POA: Diagnosis not present

## 2016-09-02 DIAGNOSIS — Z125 Encounter for screening for malignant neoplasm of prostate: Secondary | ICD-10-CM | POA: Diagnosis not present

## 2016-09-02 DIAGNOSIS — Z0000 Encounter for general adult medical examination without abnormal findings: Secondary | ICD-10-CM | POA: Diagnosis not present

## 2016-09-02 DIAGNOSIS — E1129 Type 2 diabetes mellitus with other diabetic kidney complication: Secondary | ICD-10-CM | POA: Diagnosis not present

## 2016-09-02 DIAGNOSIS — Z23 Encounter for immunization: Secondary | ICD-10-CM | POA: Diagnosis not present

## 2016-09-02 DIAGNOSIS — I10 Essential (primary) hypertension: Secondary | ICD-10-CM | POA: Diagnosis not present

## 2016-09-02 DIAGNOSIS — I129 Hypertensive chronic kidney disease with stage 1 through stage 4 chronic kidney disease, or unspecified chronic kidney disease: Secondary | ICD-10-CM | POA: Diagnosis not present

## 2016-09-02 DIAGNOSIS — Z79899 Other long term (current) drug therapy: Secondary | ICD-10-CM | POA: Diagnosis not present

## 2016-09-02 DIAGNOSIS — N184 Chronic kidney disease, stage 4 (severe): Secondary | ICD-10-CM | POA: Diagnosis not present

## 2016-09-02 DIAGNOSIS — E7800 Pure hypercholesterolemia, unspecified: Secondary | ICD-10-CM | POA: Diagnosis not present

## 2016-11-08 DIAGNOSIS — Z1211 Encounter for screening for malignant neoplasm of colon: Secondary | ICD-10-CM | POA: Diagnosis not present

## 2016-12-27 DIAGNOSIS — E038 Other specified hypothyroidism: Secondary | ICD-10-CM | POA: Diagnosis not present

## 2016-12-27 DIAGNOSIS — Z9119 Patient's noncompliance with other medical treatment and regimen: Secondary | ICD-10-CM | POA: Diagnosis not present

## 2016-12-27 DIAGNOSIS — E1129 Type 2 diabetes mellitus with other diabetic kidney complication: Secondary | ICD-10-CM | POA: Diagnosis not present

## 2016-12-27 DIAGNOSIS — I10 Essential (primary) hypertension: Secondary | ICD-10-CM | POA: Diagnosis not present

## 2016-12-27 DIAGNOSIS — E559 Vitamin D deficiency, unspecified: Secondary | ICD-10-CM | POA: Diagnosis not present

## 2016-12-27 DIAGNOSIS — M1000 Idiopathic gout, unspecified site: Secondary | ICD-10-CM | POA: Diagnosis not present

## 2016-12-27 DIAGNOSIS — E018 Other iodine-deficiency related thyroid disorders and allied conditions: Secondary | ICD-10-CM | POA: Diagnosis not present

## 2016-12-27 DIAGNOSIS — E7800 Pure hypercholesterolemia, unspecified: Secondary | ICD-10-CM | POA: Diagnosis not present

## 2016-12-27 DIAGNOSIS — Z79899 Other long term (current) drug therapy: Secondary | ICD-10-CM | POA: Diagnosis not present

## 2017-02-07 DIAGNOSIS — K635 Polyp of colon: Secondary | ICD-10-CM | POA: Diagnosis not present

## 2017-02-07 DIAGNOSIS — D128 Benign neoplasm of rectum: Secondary | ICD-10-CM | POA: Diagnosis not present

## 2017-02-07 DIAGNOSIS — D124 Benign neoplasm of descending colon: Secondary | ICD-10-CM | POA: Diagnosis not present

## 2017-02-07 DIAGNOSIS — K621 Rectal polyp: Secondary | ICD-10-CM | POA: Diagnosis not present

## 2017-02-07 DIAGNOSIS — Z1211 Encounter for screening for malignant neoplasm of colon: Secondary | ICD-10-CM | POA: Diagnosis not present

## 2017-02-07 DIAGNOSIS — K648 Other hemorrhoids: Secondary | ICD-10-CM | POA: Diagnosis not present

## 2017-02-07 DIAGNOSIS — K5730 Diverticulosis of large intestine without perforation or abscess without bleeding: Secondary | ICD-10-CM | POA: Diagnosis not present

## 2017-02-07 DIAGNOSIS — D125 Benign neoplasm of sigmoid colon: Secondary | ICD-10-CM | POA: Diagnosis not present

## 2017-02-07 DIAGNOSIS — I480 Paroxysmal atrial fibrillation: Secondary | ICD-10-CM | POA: Diagnosis not present

## 2017-02-07 DIAGNOSIS — Q438 Other specified congenital malformations of intestine: Secondary | ICD-10-CM | POA: Diagnosis not present

## 2017-02-07 DIAGNOSIS — E119 Type 2 diabetes mellitus without complications: Secondary | ICD-10-CM | POA: Diagnosis not present

## 2017-02-07 DIAGNOSIS — I4891 Unspecified atrial fibrillation: Secondary | ICD-10-CM | POA: Diagnosis not present

## 2017-03-21 DIAGNOSIS — I4891 Unspecified atrial fibrillation: Secondary | ICD-10-CM | POA: Diagnosis not present

## 2017-03-21 DIAGNOSIS — E1129 Type 2 diabetes mellitus with other diabetic kidney complication: Secondary | ICD-10-CM | POA: Diagnosis not present

## 2017-03-21 DIAGNOSIS — E7800 Pure hypercholesterolemia, unspecified: Secondary | ICD-10-CM | POA: Diagnosis not present

## 2017-03-21 DIAGNOSIS — Z79899 Other long term (current) drug therapy: Secondary | ICD-10-CM | POA: Diagnosis not present

## 2017-03-21 DIAGNOSIS — E038 Other specified hypothyroidism: Secondary | ICD-10-CM | POA: Diagnosis not present

## 2017-03-21 DIAGNOSIS — I10 Essential (primary) hypertension: Secondary | ICD-10-CM | POA: Diagnosis not present

## 2017-06-22 DIAGNOSIS — E038 Other specified hypothyroidism: Secondary | ICD-10-CM | POA: Diagnosis not present

## 2017-06-22 DIAGNOSIS — I10 Essential (primary) hypertension: Secondary | ICD-10-CM | POA: Diagnosis not present

## 2017-06-22 DIAGNOSIS — M1000 Idiopathic gout, unspecified site: Secondary | ICD-10-CM | POA: Diagnosis not present

## 2017-06-22 DIAGNOSIS — N184 Chronic kidney disease, stage 4 (severe): Secondary | ICD-10-CM | POA: Diagnosis not present

## 2017-06-22 DIAGNOSIS — I4891 Unspecified atrial fibrillation: Secondary | ICD-10-CM | POA: Diagnosis not present

## 2017-06-22 DIAGNOSIS — E1129 Type 2 diabetes mellitus with other diabetic kidney complication: Secondary | ICD-10-CM | POA: Diagnosis not present

## 2017-06-22 DIAGNOSIS — Z79899 Other long term (current) drug therapy: Secondary | ICD-10-CM | POA: Diagnosis not present

## 2017-06-22 DIAGNOSIS — E7800 Pure hypercholesterolemia, unspecified: Secondary | ICD-10-CM | POA: Diagnosis not present

## 2017-09-07 DIAGNOSIS — I4891 Unspecified atrial fibrillation: Secondary | ICD-10-CM | POA: Diagnosis not present

## 2017-09-07 DIAGNOSIS — M1000 Idiopathic gout, unspecified site: Secondary | ICD-10-CM | POA: Diagnosis not present

## 2017-09-07 DIAGNOSIS — E1129 Type 2 diabetes mellitus with other diabetic kidney complication: Secondary | ICD-10-CM | POA: Diagnosis not present

## 2017-09-07 DIAGNOSIS — I129 Hypertensive chronic kidney disease with stage 1 through stage 4 chronic kidney disease, or unspecified chronic kidney disease: Secondary | ICD-10-CM | POA: Diagnosis not present

## 2017-09-07 DIAGNOSIS — E038 Other specified hypothyroidism: Secondary | ICD-10-CM | POA: Diagnosis not present

## 2017-09-07 DIAGNOSIS — N184 Chronic kidney disease, stage 4 (severe): Secondary | ICD-10-CM | POA: Diagnosis not present

## 2017-09-07 DIAGNOSIS — I10 Essential (primary) hypertension: Secondary | ICD-10-CM | POA: Diagnosis not present

## 2017-09-07 DIAGNOSIS — Z6841 Body Mass Index (BMI) 40.0 and over, adult: Secondary | ICD-10-CM | POA: Diagnosis not present

## 2017-09-07 DIAGNOSIS — G4733 Obstructive sleep apnea (adult) (pediatric): Secondary | ICD-10-CM | POA: Diagnosis not present

## 2017-09-07 DIAGNOSIS — Z0000 Encounter for general adult medical examination without abnormal findings: Secondary | ICD-10-CM | POA: Diagnosis not present

## 2017-09-07 DIAGNOSIS — E7800 Pure hypercholesterolemia, unspecified: Secondary | ICD-10-CM | POA: Diagnosis not present

## 2017-12-08 DIAGNOSIS — E038 Other specified hypothyroidism: Secondary | ICD-10-CM | POA: Diagnosis not present

## 2017-12-08 DIAGNOSIS — M1000 Idiopathic gout, unspecified site: Secondary | ICD-10-CM | POA: Diagnosis not present

## 2017-12-08 DIAGNOSIS — N184 Chronic kidney disease, stage 4 (severe): Secondary | ICD-10-CM | POA: Diagnosis not present

## 2017-12-08 DIAGNOSIS — E7800 Pure hypercholesterolemia, unspecified: Secondary | ICD-10-CM | POA: Diagnosis not present

## 2017-12-08 DIAGNOSIS — I10 Essential (primary) hypertension: Secondary | ICD-10-CM | POA: Diagnosis not present

## 2017-12-08 DIAGNOSIS — Z79899 Other long term (current) drug therapy: Secondary | ICD-10-CM | POA: Diagnosis not present

## 2017-12-08 DIAGNOSIS — E1129 Type 2 diabetes mellitus with other diabetic kidney complication: Secondary | ICD-10-CM | POA: Diagnosis not present

## 2018-03-08 DIAGNOSIS — I10 Essential (primary) hypertension: Secondary | ICD-10-CM | POA: Diagnosis not present

## 2018-03-08 DIAGNOSIS — E038 Other specified hypothyroidism: Secondary | ICD-10-CM | POA: Diagnosis not present

## 2018-03-08 DIAGNOSIS — E1129 Type 2 diabetes mellitus with other diabetic kidney complication: Secondary | ICD-10-CM | POA: Diagnosis not present

## 2018-03-08 DIAGNOSIS — E7800 Pure hypercholesterolemia, unspecified: Secondary | ICD-10-CM | POA: Diagnosis not present

## 2018-03-08 DIAGNOSIS — Z6841 Body Mass Index (BMI) 40.0 and over, adult: Secondary | ICD-10-CM | POA: Diagnosis not present

## 2018-03-08 DIAGNOSIS — I129 Hypertensive chronic kidney disease with stage 1 through stage 4 chronic kidney disease, or unspecified chronic kidney disease: Secondary | ICD-10-CM | POA: Diagnosis not present

## 2018-03-08 DIAGNOSIS — N184 Chronic kidney disease, stage 4 (severe): Secondary | ICD-10-CM | POA: Diagnosis not present

## 2018-05-19 DIAGNOSIS — H26492 Other secondary cataract, left eye: Secondary | ICD-10-CM | POA: Diagnosis not present

## 2018-05-19 DIAGNOSIS — H26491 Other secondary cataract, right eye: Secondary | ICD-10-CM | POA: Diagnosis not present

## 2018-05-19 DIAGNOSIS — G119 Hereditary ataxia, unspecified: Secondary | ICD-10-CM | POA: Diagnosis not present

## 2018-06-02 DIAGNOSIS — H26491 Other secondary cataract, right eye: Secondary | ICD-10-CM | POA: Diagnosis not present

## 2018-06-12 DIAGNOSIS — M1000 Idiopathic gout, unspecified site: Secondary | ICD-10-CM | POA: Diagnosis not present

## 2018-06-12 DIAGNOSIS — Z79899 Other long term (current) drug therapy: Secondary | ICD-10-CM | POA: Diagnosis not present

## 2018-06-12 DIAGNOSIS — Z6841 Body Mass Index (BMI) 40.0 and over, adult: Secondary | ICD-10-CM | POA: Diagnosis not present

## 2018-06-12 DIAGNOSIS — E1169 Type 2 diabetes mellitus with other specified complication: Secondary | ICD-10-CM | POA: Diagnosis not present

## 2018-06-12 DIAGNOSIS — E038 Other specified hypothyroidism: Secondary | ICD-10-CM | POA: Diagnosis not present

## 2018-06-12 DIAGNOSIS — N184 Chronic kidney disease, stage 4 (severe): Secondary | ICD-10-CM | POA: Diagnosis not present

## 2018-06-12 DIAGNOSIS — E7800 Pure hypercholesterolemia, unspecified: Secondary | ICD-10-CM | POA: Diagnosis not present

## 2018-06-12 DIAGNOSIS — I10 Essential (primary) hypertension: Secondary | ICD-10-CM | POA: Diagnosis not present

## 2018-07-12 DIAGNOSIS — I129 Hypertensive chronic kidney disease with stage 1 through stage 4 chronic kidney disease, or unspecified chronic kidney disease: Secondary | ICD-10-CM | POA: Diagnosis not present

## 2018-07-12 DIAGNOSIS — N184 Chronic kidney disease, stage 4 (severe): Secondary | ICD-10-CM | POA: Diagnosis not present

## 2018-08-01 DIAGNOSIS — H02413 Mechanical ptosis of bilateral eyelids: Secondary | ICD-10-CM | POA: Diagnosis not present

## 2018-08-02 DIAGNOSIS — Z01818 Encounter for other preprocedural examination: Secondary | ICD-10-CM | POA: Diagnosis not present

## 2018-08-02 DIAGNOSIS — H02413 Mechanical ptosis of bilateral eyelids: Secondary | ICD-10-CM | POA: Diagnosis not present

## 2018-08-16 DIAGNOSIS — H02413 Mechanical ptosis of bilateral eyelids: Secondary | ICD-10-CM | POA: Diagnosis not present

## 2018-09-07 DIAGNOSIS — Z0000 Encounter for general adult medical examination without abnormal findings: Secondary | ICD-10-CM | POA: Diagnosis not present

## 2018-09-07 DIAGNOSIS — Z6841 Body Mass Index (BMI) 40.0 and over, adult: Secondary | ICD-10-CM | POA: Diagnosis not present

## 2018-09-07 DIAGNOSIS — E7800 Pure hypercholesterolemia, unspecified: Secondary | ICD-10-CM | POA: Diagnosis not present

## 2018-09-07 DIAGNOSIS — F325 Major depressive disorder, single episode, in full remission: Secondary | ICD-10-CM | POA: Diagnosis not present

## 2018-09-07 DIAGNOSIS — I10 Essential (primary) hypertension: Secondary | ICD-10-CM | POA: Diagnosis not present

## 2018-09-07 DIAGNOSIS — Z79899 Other long term (current) drug therapy: Secondary | ICD-10-CM | POA: Diagnosis not present

## 2018-09-07 DIAGNOSIS — I129 Hypertensive chronic kidney disease with stage 1 through stage 4 chronic kidney disease, or unspecified chronic kidney disease: Secondary | ICD-10-CM | POA: Diagnosis not present

## 2018-09-07 DIAGNOSIS — N184 Chronic kidney disease, stage 4 (severe): Secondary | ICD-10-CM | POA: Diagnosis not present

## 2018-09-07 DIAGNOSIS — G4733 Obstructive sleep apnea (adult) (pediatric): Secondary | ICD-10-CM | POA: Diagnosis not present

## 2018-09-07 DIAGNOSIS — E1169 Type 2 diabetes mellitus with other specified complication: Secondary | ICD-10-CM | POA: Diagnosis not present

## 2018-09-07 DIAGNOSIS — E038 Other specified hypothyroidism: Secondary | ICD-10-CM | POA: Diagnosis not present

## 2018-09-07 DIAGNOSIS — M1000 Idiopathic gout, unspecified site: Secondary | ICD-10-CM | POA: Diagnosis not present

## 2018-12-14 DIAGNOSIS — E1169 Type 2 diabetes mellitus with other specified complication: Secondary | ICD-10-CM | POA: Diagnosis not present

## 2018-12-14 DIAGNOSIS — N184 Chronic kidney disease, stage 4 (severe): Secondary | ICD-10-CM | POA: Diagnosis not present

## 2018-12-14 DIAGNOSIS — R0602 Shortness of breath: Secondary | ICD-10-CM | POA: Diagnosis not present

## 2018-12-14 DIAGNOSIS — J90 Pleural effusion, not elsewhere classified: Secondary | ICD-10-CM | POA: Diagnosis not present

## 2018-12-14 DIAGNOSIS — I10 Essential (primary) hypertension: Secondary | ICD-10-CM | POA: Diagnosis not present

## 2018-12-14 DIAGNOSIS — I517 Cardiomegaly: Secondary | ICD-10-CM | POA: Diagnosis not present

## 2018-12-14 DIAGNOSIS — F325 Major depressive disorder, single episode, in full remission: Secondary | ICD-10-CM | POA: Diagnosis not present

## 2018-12-14 DIAGNOSIS — G4733 Obstructive sleep apnea (adult) (pediatric): Secondary | ICD-10-CM | POA: Diagnosis not present

## 2018-12-14 DIAGNOSIS — E7800 Pure hypercholesterolemia, unspecified: Secondary | ICD-10-CM | POA: Diagnosis not present

## 2018-12-18 DIAGNOSIS — I5031 Acute diastolic (congestive) heart failure: Secondary | ICD-10-CM | POA: Diagnosis not present

## 2018-12-18 DIAGNOSIS — R0602 Shortness of breath: Secondary | ICD-10-CM | POA: Diagnosis not present

## 2018-12-19 DIAGNOSIS — I361 Nonrheumatic tricuspid (valve) insufficiency: Secondary | ICD-10-CM | POA: Diagnosis not present

## 2018-12-19 DIAGNOSIS — I501 Left ventricular failure: Secondary | ICD-10-CM | POA: Diagnosis not present

## 2018-12-21 DIAGNOSIS — I5031 Acute diastolic (congestive) heart failure: Secondary | ICD-10-CM | POA: Diagnosis not present

## 2019-03-14 DIAGNOSIS — E1169 Type 2 diabetes mellitus with other specified complication: Secondary | ICD-10-CM | POA: Diagnosis not present

## 2019-03-14 DIAGNOSIS — Z6841 Body Mass Index (BMI) 40.0 and over, adult: Secondary | ICD-10-CM | POA: Diagnosis not present

## 2019-03-14 DIAGNOSIS — G479 Sleep disorder, unspecified: Secondary | ICD-10-CM | POA: Diagnosis not present

## 2019-03-14 DIAGNOSIS — I10 Essential (primary) hypertension: Secondary | ICD-10-CM | POA: Diagnosis not present

## 2019-03-14 DIAGNOSIS — I5032 Chronic diastolic (congestive) heart failure: Secondary | ICD-10-CM | POA: Diagnosis not present

## 2019-03-14 DIAGNOSIS — E7800 Pure hypercholesterolemia, unspecified: Secondary | ICD-10-CM | POA: Diagnosis not present

## 2019-06-21 DIAGNOSIS — Z794 Long term (current) use of insulin: Secondary | ICD-10-CM | POA: Diagnosis not present

## 2019-06-21 DIAGNOSIS — E1169 Type 2 diabetes mellitus with other specified complication: Secondary | ICD-10-CM | POA: Diagnosis not present

## 2019-06-21 DIAGNOSIS — E7800 Pure hypercholesterolemia, unspecified: Secondary | ICD-10-CM | POA: Diagnosis not present

## 2019-06-21 DIAGNOSIS — I5032 Chronic diastolic (congestive) heart failure: Secondary | ICD-10-CM | POA: Diagnosis not present

## 2019-06-21 DIAGNOSIS — Z79899 Other long term (current) drug therapy: Secondary | ICD-10-CM | POA: Diagnosis not present

## 2019-06-21 DIAGNOSIS — I10 Essential (primary) hypertension: Secondary | ICD-10-CM | POA: Diagnosis not present

## 2019-06-21 DIAGNOSIS — Z6841 Body Mass Index (BMI) 40.0 and over, adult: Secondary | ICD-10-CM | POA: Diagnosis not present

## 2019-06-21 DIAGNOSIS — E038 Other specified hypothyroidism: Secondary | ICD-10-CM | POA: Diagnosis not present

## 2019-10-18 DIAGNOSIS — Z0000 Encounter for general adult medical examination without abnormal findings: Secondary | ICD-10-CM | POA: Diagnosis not present

## 2019-10-18 DIAGNOSIS — E038 Other specified hypothyroidism: Secondary | ICD-10-CM | POA: Diagnosis not present

## 2019-10-18 DIAGNOSIS — F325 Major depressive disorder, single episode, in full remission: Secondary | ICD-10-CM | POA: Diagnosis not present

## 2019-10-18 DIAGNOSIS — G4733 Obstructive sleep apnea (adult) (pediatric): Secondary | ICD-10-CM | POA: Diagnosis not present

## 2019-10-18 DIAGNOSIS — Z23 Encounter for immunization: Secondary | ICD-10-CM | POA: Diagnosis not present

## 2019-10-18 DIAGNOSIS — E7800 Pure hypercholesterolemia, unspecified: Secondary | ICD-10-CM | POA: Diagnosis not present

## 2019-10-18 DIAGNOSIS — Z6841 Body Mass Index (BMI) 40.0 and over, adult: Secondary | ICD-10-CM | POA: Diagnosis not present

## 2019-10-18 DIAGNOSIS — N184 Chronic kidney disease, stage 4 (severe): Secondary | ICD-10-CM | POA: Diagnosis not present

## 2019-10-18 DIAGNOSIS — E1169 Type 2 diabetes mellitus with other specified complication: Secondary | ICD-10-CM | POA: Diagnosis not present

## 2019-10-18 DIAGNOSIS — I10 Essential (primary) hypertension: Secondary | ICD-10-CM | POA: Diagnosis not present

## 2020-01-28 DIAGNOSIS — Z03818 Encounter for observation for suspected exposure to other biological agents ruled out: Secondary | ICD-10-CM | POA: Diagnosis not present

## 2020-01-28 DIAGNOSIS — J069 Acute upper respiratory infection, unspecified: Secondary | ICD-10-CM | POA: Diagnosis not present

## 2020-02-11 DIAGNOSIS — E1169 Type 2 diabetes mellitus with other specified complication: Secondary | ICD-10-CM | POA: Diagnosis not present

## 2020-02-11 DIAGNOSIS — I10 Essential (primary) hypertension: Secondary | ICD-10-CM | POA: Diagnosis not present

## 2020-02-11 DIAGNOSIS — I4891 Unspecified atrial fibrillation: Secondary | ICD-10-CM | POA: Diagnosis not present

## 2020-02-11 DIAGNOSIS — E038 Other specified hypothyroidism: Secondary | ICD-10-CM | POA: Diagnosis not present

## 2020-02-11 DIAGNOSIS — I5032 Chronic diastolic (congestive) heart failure: Secondary | ICD-10-CM | POA: Diagnosis not present

## 2020-02-11 DIAGNOSIS — E7800 Pure hypercholesterolemia, unspecified: Secondary | ICD-10-CM | POA: Diagnosis not present

## 2020-02-11 DIAGNOSIS — Z6841 Body Mass Index (BMI) 40.0 and over, adult: Secondary | ICD-10-CM | POA: Diagnosis not present

## 2020-05-12 DIAGNOSIS — N184 Chronic kidney disease, stage 4 (severe): Secondary | ICD-10-CM | POA: Diagnosis not present

## 2020-05-12 DIAGNOSIS — E7800 Pure hypercholesterolemia, unspecified: Secondary | ICD-10-CM | POA: Diagnosis not present

## 2020-05-12 DIAGNOSIS — E1169 Type 2 diabetes mellitus with other specified complication: Secondary | ICD-10-CM | POA: Diagnosis not present

## 2020-05-12 DIAGNOSIS — I10 Essential (primary) hypertension: Secondary | ICD-10-CM | POA: Diagnosis not present

## 2020-05-12 DIAGNOSIS — Z6841 Body Mass Index (BMI) 40.0 and over, adult: Secondary | ICD-10-CM | POA: Diagnosis not present

## 2020-05-12 DIAGNOSIS — E038 Other specified hypothyroidism: Secondary | ICD-10-CM | POA: Diagnosis not present

## 2020-08-12 DIAGNOSIS — G4733 Obstructive sleep apnea (adult) (pediatric): Secondary | ICD-10-CM | POA: Diagnosis not present

## 2020-08-12 DIAGNOSIS — E1169 Type 2 diabetes mellitus with other specified complication: Secondary | ICD-10-CM | POA: Diagnosis not present

## 2020-08-12 DIAGNOSIS — I5032 Chronic diastolic (congestive) heart failure: Secondary | ICD-10-CM | POA: Diagnosis not present

## 2020-08-12 DIAGNOSIS — Z23 Encounter for immunization: Secondary | ICD-10-CM | POA: Diagnosis not present

## 2020-08-12 DIAGNOSIS — F325 Major depressive disorder, single episode, in full remission: Secondary | ICD-10-CM | POA: Diagnosis not present

## 2020-08-12 DIAGNOSIS — E038 Other specified hypothyroidism: Secondary | ICD-10-CM | POA: Diagnosis not present

## 2020-08-12 DIAGNOSIS — E7800 Pure hypercholesterolemia, unspecified: Secondary | ICD-10-CM | POA: Diagnosis not present

## 2020-08-12 DIAGNOSIS — Z6841 Body Mass Index (BMI) 40.0 and over, adult: Secondary | ICD-10-CM | POA: Diagnosis not present

## 2020-08-12 DIAGNOSIS — I10 Essential (primary) hypertension: Secondary | ICD-10-CM | POA: Diagnosis not present

## 2020-11-18 DIAGNOSIS — J069 Acute upper respiratory infection, unspecified: Secondary | ICD-10-CM | POA: Diagnosis not present

## 2020-11-18 DIAGNOSIS — J0100 Acute maxillary sinusitis, unspecified: Secondary | ICD-10-CM | POA: Diagnosis not present

## 2021-07-29 DIAGNOSIS — I10 Essential (primary) hypertension: Secondary | ICD-10-CM | POA: Diagnosis not present

## 2021-07-29 DIAGNOSIS — F325 Major depressive disorder, single episode, in full remission: Secondary | ICD-10-CM | POA: Diagnosis not present

## 2021-07-29 DIAGNOSIS — E7800 Pure hypercholesterolemia, unspecified: Secondary | ICD-10-CM | POA: Diagnosis not present

## 2021-07-29 DIAGNOSIS — I129 Hypertensive chronic kidney disease with stage 1 through stage 4 chronic kidney disease, or unspecified chronic kidney disease: Secondary | ICD-10-CM | POA: Diagnosis not present

## 2021-07-29 DIAGNOSIS — Z0000 Encounter for general adult medical examination without abnormal findings: Secondary | ICD-10-CM | POA: Diagnosis not present

## 2021-07-29 DIAGNOSIS — I5032 Chronic diastolic (congestive) heart failure: Secondary | ICD-10-CM | POA: Diagnosis not present

## 2021-07-29 DIAGNOSIS — M1000 Idiopathic gout, unspecified site: Secondary | ICD-10-CM | POA: Diagnosis not present

## 2021-07-29 DIAGNOSIS — N184 Chronic kidney disease, stage 4 (severe): Secondary | ICD-10-CM | POA: Diagnosis not present

## 2021-07-29 DIAGNOSIS — G4733 Obstructive sleep apnea (adult) (pediatric): Secondary | ICD-10-CM | POA: Diagnosis not present

## 2021-07-29 DIAGNOSIS — E1169 Type 2 diabetes mellitus with other specified complication: Secondary | ICD-10-CM | POA: Diagnosis not present

## 2021-07-29 DIAGNOSIS — Z23 Encounter for immunization: Secondary | ICD-10-CM | POA: Diagnosis not present

## 2021-07-29 DIAGNOSIS — Z6841 Body Mass Index (BMI) 40.0 and over, adult: Secondary | ICD-10-CM | POA: Diagnosis not present

## 2022-11-01 DEATH — deceased
